# Patient Record
Sex: Male | Born: 1995 | Hispanic: Yes | Marital: Married
Health system: Midwestern US, Community
[De-identification: ages and names within clinical notes are randomized; demographics above are authoritative.]

---
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
## 2017-04-04 ENCOUNTER — Encounter: Payer: Self-pay | Admitting: Emergency Medicine

## 2017-04-04 ENCOUNTER — Ambulatory Visit (INDEPENDENT_AMBULATORY_CARE_PROVIDER_SITE_OTHER): Payer: BLUE CROSS/BLUE SHIELD | Admitting: Emergency Medicine

## 2017-04-04 VITALS — BP 110/66 | HR 66 | Temp 98.3°F | Resp 16 | Ht 68.5 in | Wt 123.0 lb

## 2017-04-04 DIAGNOSIS — R634 Abnormal weight loss: Secondary | ICD-10-CM | POA: Diagnosis not present

## 2017-04-04 NOTE — Progress Notes (Signed)
Collin Moore 21 y.o.   Chief Complaint  Patient presents with  . Weight Loss    x 3-4 months    HISTORY OF PRESENT ILLNESS: This is a 21 y.o. male complaining of recent weight loss, approx 10 lbs since returning from [place] last July. Denies diarrhea, n/v, fever, or any other significant symptoms. Smoking:no Drinking:no Sleeping:adequate Work:in school Exercise:martial arts Stress:high; girlfriend pregnant Nutrition:great apettite Drugs:no HPI   Prior to Admission medications   Medication Sig Start Date End Date Taking? Authorizing Provider  cyclobenzaprine (FLEXERIL) 10 MG tablet Take 1 tablet (10 mg total) by mouth at bedtime as needed for muscle spasms. Patient not taking: Reported on 04/04/2017 09/11/16   Russo, [place] N, PA-C  ibuprofen (ADVIL,MOTRIN) 600 MG tablet Take 1 tablet (600 mg total) by mouth every 6 (six) hours as needed. Patient not taking: Reported on 04/04/2017 08/28/16   Audry PiliMohr, Tyler, PA-C  methocarbamol (ROBAXIN) 500 MG tablet Take 1 tablet (500 mg total) by mouth 2 (two) times daily. Patient not taking: Reported on 04/04/2017 08/28/16   Audry PiliMohr, Tyler, PA-C    No Known Allergies  There are no active problems to display for this patient.   No past medical history on file.  No past surgical history on file.  Social History   Social History  . Marital status: Single    Spouse name: N/A  . Number of children: N/A  . Years of education: N/A   Occupational History  . Not on file.   Social History Main Topics  . Smoking status: Never Smoker  . Smokeless tobacco: Never Used  . Alcohol use No  . Drug use: No  . Sexual activity: Not on file   Other Topics Concern  . Not on file   Social History Narrative  . No narrative on file    No family history on file.   Review of Systems  Constitutional: Positive for weight loss. Negative for chills, fever and malaise/fatigue.  HENT: Negative.   Eyes: Negative.   Respiratory: Negative.  Negative for  cough, hemoptysis and shortness of breath.   Cardiovascular: Negative.  Negative for chest pain.  Gastrointestinal: Negative.  Negative for abdominal pain, blood in stool, diarrhea, melena, nausea and vomiting.  Genitourinary: Negative.  Negative for dysuria and hematuria.  Musculoskeletal: Negative.  Negative for back pain, joint pain and neck pain.  Skin: Negative.  Negative for rash.  Neurological: Negative.  Negative for dizziness, focal weakness, weakness and headaches.  Endo/Heme/Allergies: Negative.   All other systems reviewed and are negative.   Vitals:   04/04/17 1528  BP: 110/66  Pulse: 66  Resp: 16  Temp: 98.3 F (36.8 C)  SpO2: 100%    Physical Exam  Constitutional: He is oriented to person, place, and time. He appears well-developed.  thin  HENT:  Head: Normocephalic and atraumatic.  Nose: Nose normal.  Mouth/Throat: Oropharynx is clear and moist.  Eyes: Pupils are equal, round, and reactive to light. Conjunctivae and EOM are normal.  Neck: Normal range of motion. Neck supple. No JVD present. No thyromegaly present.  Cardiovascular: Normal rate, regular rhythm, normal heart sounds and intact distal pulses.   Pulmonary/Chest: Effort normal and breath sounds normal.  Abdominal: Soft. Bowel sounds are normal. He exhibits no distension and no mass. There is no tenderness.  Musculoskeletal: Normal range of motion.  Neurological: He is alert and oriented to person, place, and time. No sensory deficit. He exhibits normal muscle tone.  Skin: Skin is warm and  dry. Capillary refill takes less than 2 seconds. No rash noted.  Psychiatric: He has a normal mood and affect. His behavior is normal.  Vitals reviewed.    ASSESSMENT & PLAN: [name] was seen today for weight loss.  Diagnoses and all orders for this visit:  Loss of weight -     CBC with Differential/Platelet -     Comprehensive metabolic panel -     Thyroid Profile -     HIV antibody    Patient  Instructions       IF you received an x-ray today, you will receive an invoice from [facility]. Please contact [facility] at [phone] with questions or concerns regarding your invoice.   IF you received labwork today, you will receive an invoice from [facility]. Please contact [facility] at [phone] with questions or concerns regarding your invoice.   Our billing staff will not be able to assist you with questions regarding bills from these companies.  You will be contacted with the lab results as soon as they are available. The fastest way to get your results is to activate your My Chart account. Instructions are located on the last page of this paperwork. If you have not heard from [place] regarding the results in 2 weeks, please contact this office.     Failure to Thrive, Adult Failure to thrive is a group of problems. These problems include eating too little and losing weight. People who have this condition may do fewer and fewer activities over time. They may lose interest in being with friends or they may not want to eat or drink. Follow these instructions at home:  Take medicines only as told by your doctor.  Eat a healthy, well-balanced diet. Make sure that you eat enough.  Be active. Do strength training. A physical therapist can help to set up an exercise program that fits you.  Make sure that you are safe at home.  Make sure that you have a plan for what to do if you cannot make decisions for yourself. Contact a doctor if:  You are not able to eat well.  You are not able to move around.  You feel very sad.  You feel very hopeless. Get help right away if:  You think about ending your life.  You cannot eat or drink.  You do not get out of bed.  Staying at home is not safe.  You have a fever. This information is not intended to replace advice given to you by your health care provider. Make sure you discuss any questions you have with your  health care provider. Document Released: 05/25/2011 Document Revised: 11/11/2015 Document Reviewed: 08/31/2014 Elsevier Interactive Patient Education  2018 Elsevier Inc.      Edwina Barth, MD [facility]

## 2017-04-04 NOTE — Patient Instructions (Addendum)
     IF you received an x-ray today, you will receive an invoice from [facility]. Please contact [facility] at [phone] with questions or concerns regarding your invoice.   IF you received labwork today, you will receive an invoice from [facility]. Please contact [facility] at [phone] with questions or concerns regarding your invoice.   Our billing staff will not be able to assist you with questions regarding bills from these companies.  You will be contacted with the lab results as soon as they are available. The fastest way to get your results is to activate your My Chart account. Instructions are located on the last page of this paperwork. If you have not heard from us regarding the results in 2 weeks, please contact this office.     Failure to Thrive, Adult Failure to thrive is a group of problems. These problems include eating too little and losing weight. People who have this condition may do fewer and fewer activities over time. They may lose interest in being with friends or they may not want to eat or drink. Follow these instructions at home:  Take medicines only as told by your doctor.  Eat a healthy, well-balanced diet. Make sure that you eat enough.  Be active. Do strength training. A physical therapist can help to set up an exercise program that fits you.  Make sure that you are safe at home.  Make sure that you have a plan for what to do if you cannot make decisions for yourself. Contact a doctor if:  You are not able to eat well.  You are not able to move around.  You feel very sad.  You feel very hopeless. Get help right away if:  You think about ending your life.  You cannot eat or drink.  You do not get out of bed.  Staying at home is not safe.  You have a fever. This information is not intended to replace advice given to you by your health care provider. Make sure you discuss any questions you have with your health care  provider. Document Released: 05/25/2011 Document Revised: 11/11/2015 Document Reviewed: 08/31/2014 Elsevier Interactive Patient Education  [employer]2018 Elsevier Inc.

## 2017-04-04 NOTE — Addendum Note (Signed)
Addended by: Isaac BlissGALLOWAY, Bowie Doiron J on: 04/04/2017 04:37 PM   Modules accepted: Orders

## 2017-04-05 LAB — COMPREHENSIVE METABOLIC PANEL
ALBUMIN: 4.9 g/dL (ref 3.5–5.5)
ALT: 16 IU/L (ref 0–44)
AST: 15 IU/L (ref 0–40)
Albumin/Globulin Ratio: 2 (ref 1.2–2.2)
Alkaline Phosphatase: 42 IU/L (ref 39–117)
BILIRUBIN TOTAL: 0.8 mg/dL (ref 0.0–1.2)
BUN / CREAT RATIO: 8 — AB (ref 9–20)
BUN: 7 mg/dL (ref 6–20)
CHLORIDE: 102 mmol/L (ref 96–106)
CO2: 27 mmol/L (ref 20–29)
CREATININE: 0.83 mg/dL (ref 0.76–1.27)
Calcium: 9.5 mg/dL (ref 8.7–10.2)
GFR calc non Af Amer: 128 mL/min/{1.73_m2} (ref >59)
GFR, EST AFRICAN AMERICAN: 147 mL/min/{1.73_m2} (ref >59)
GLOBULIN, TOTAL: 2.5 g/dL (ref 1.5–4.5)
GLUCOSE: 74 mg/dL (ref 65–99)
Potassium: 4.1 mmol/L (ref 3.5–5.2)
SODIUM: 143 mmol/L (ref 134–144)
TOTAL PROTEIN: 7.4 g/dL (ref 6.0–8.5)

## 2017-04-05 LAB — THYROID PANEL
FREE THYROXINE INDEX: 2.2 (ref 1.2–4.9)
T3 Uptake Ratio: 25 % (ref 24–39)
T4, Total: 8.6 ug/dL (ref 4.5–12.0)

## 2017-04-05 LAB — CBC WITH DIFFERENTIAL/PLATELET
BASOS ABS: 0 10*3/uL (ref 0.0–0.2)
BASOS: 0 %
EOS (ABSOLUTE): 0.2 10*3/uL (ref 0.0–0.4)
Eos: 3 %
HEMATOCRIT: 43.2 % (ref 37.5–51.0)
HEMOGLOBIN: 15 g/dL (ref 13.0–17.7)
IMMATURE GRANS (ABS): 0 10*3/uL (ref 0.0–0.1)
Immature Granulocytes: 0 %
LYMPHS: 24 %
Lymphocytes Absolute: 1.3 10*3/uL (ref 0.7–3.1)
MCH: 30.6 pg (ref 26.6–33.0)
MCHC: 34.7 g/dL (ref 31.5–35.7)
MCV: 88 fL (ref 79–97)
Monocytes Absolute: 0.4 10*3/uL (ref 0.1–0.9)
Monocytes: 8 %
NEUTROS ABS: 3.4 10*3/uL (ref 1.4–7.0)
Neutrophils: 65 %
Platelets: 161 10*3/uL (ref 150–379)
RBC: 4.9 x10E6/uL (ref 4.14–5.80)
RDW: 13.6 % (ref 12.3–15.4)
WBC: 5.3 10*3/uL (ref 3.4–10.8)

## 2017-04-05 LAB — HIV ANTIBODY (ROUTINE TESTING W REFLEX): HIV Screen 4th Generation wRfx: NONREACTIVE

## 2017-04-06 ENCOUNTER — Encounter: Payer: Self-pay | Admitting: Radiology

## 2017-04-11 ENCOUNTER — Encounter: Payer: Self-pay | Admitting: Emergency Medicine

## 2017-04-11 ENCOUNTER — Ambulatory Visit (INDEPENDENT_AMBULATORY_CARE_PROVIDER_SITE_OTHER): Payer: BLUE CROSS/BLUE SHIELD | Admitting: Emergency Medicine

## 2017-04-11 VITALS — BP 102/62 | HR 57 | Temp 98.3°F | Resp 17 | Ht 70.0 in | Wt 126.0 lb

## 2017-04-11 DIAGNOSIS — R4582 Worries: Secondary | ICD-10-CM | POA: Diagnosis not present

## 2017-04-11 DIAGNOSIS — Z202 Contact with and (suspected) exposure to infections with a predominantly sexual mode of transmission: Secondary | ICD-10-CM | POA: Diagnosis not present

## 2017-04-11 MED ORDER — AZITHROMYCIN 1 G PO PACK: 1.0000 g | PACK | Freq: Once | ORAL | 0 refills | Status: AC

## 2017-04-11 NOTE — Progress Notes (Signed)
Collin Moore 21 y.o.   Chief Complaint  Patient presents with  . STI check    no symptoms     HISTORY OF PRESENT ILLNESS: This is a 21 y.o. male girlfriend tested positive for Chlamydia; asymptomatic. Seen by me last week for weight loss; lab results reviewed with patient in the room and all questions answered.  HPI   Prior to Admission medications   Not on File    No Known Allergies  Patient Active Problem List   Diagnosis Date Noted  . Loss of weight 04/04/2017    No past medical history on file.  No past surgical history on file.  Social History   Social History  . Marital status: Single    Spouse name: N/A  . Number of children: N/A  . Years of education: N/A   Occupational History  . Not on file.   Social History Main Topics  . Smoking status: Never Smoker  . Smokeless tobacco: Never Used  . Alcohol use No  . Drug use: No  . Sexual activity: Yes   Other Topics Concern  . Not on file   Social History Narrative  . No narrative on file    No family history on file.   Review of Systems  Constitutional: Negative.  Negative for chills and fever.  HENT: Negative for sore throat.   Eyes: Negative for discharge and redness.  Respiratory: Negative for cough and shortness of breath.   Cardiovascular: Negative for chest pain.  Gastrointestinal: Negative for abdominal pain, nausea and vomiting.  Musculoskeletal: Negative for joint pain.  Skin: Negative for rash.  Neurological: Negative for dizziness and headaches.   Vitals:   04/11/17 1343  BP: 102/62  Pulse: (!) 57  Resp: 17  Temp: 98.3 F (36.8 C)  SpO2: 98%     Physical Exam  Constitutional: He is oriented to person, place, and time. He appears well-developed and well-nourished.  HENT:  Head: Normocephalic and atraumatic.  Eyes: Pupils are equal, round, and reactive to light. EOM are normal.  Cardiovascular: Normal rate.   Pulmonary/Chest: Effort normal.  Musculoskeletal: Normal range  of motion.  Neurological: He is alert and oriented to person, place, and time.  Skin: Skin is warm and dry. Capillary refill takes less than 2 seconds. No rash noted.  Psychiatric: He has a normal mood and affect. His behavior is normal.  Vitals reviewed.    ASSESSMENT & PLAN: Bharath was seen today for sti check.  Diagnoses and all orders for this visit:  Worries  Exposure to chlamydia -     azithromycin (ZITHROMAX) 1 g powder; Take 1 packet (1 g total) by mouth once.    Patient Instructions       IF you received an x-ray today, you will receive an invoice from [facility]. Please contact [facility] at [phone] with questions or concerns regarding your invoice.   IF you received labwork today, you will receive an invoice from [facility]. Please contact [facility] at [phone] with questions or concerns regarding your invoice.   Our billing staff will not be able to assist you with questions regarding bills from these companies.  You will be contacted with the lab results as soon as they are available. The fastest way to get your results is to activate your My Chart account. Instructions are located on the last page of this paperwork. If you have not heard from [place] regarding the results in 2 weeks, please contact this office.  Preventing Sexually Transmitted Infections, Adult Sexually transmitted infections (STIs) are diseases that are passed (transmitted) from person to person through bodily fluids exchanged during sex or sexual contact. Bodily fluids include saliva, semen, blood, vaginal mucus, and urine. You may have an increased risk for developing an STI if you have unprotected oral, vaginal, or anal sex. Some common STIs include:  Herpes.  Hepatitis B.  Chlamydia.  Gonorrhea.  Syphilis.  HPV (human papillomavirus).  HIV (humanimmunodeficiency virus), the virus that can cause AIDS (acquired immunodeficiency virus).  How can I protect  myself from sexually transmitted infections? The only way to completely prevent STIs is not to have sex of any kind (practice abstinence). This includes oral, vaginal, or anal sex. If you are sexually active, take these actions to lower your risk of getting an STI:  Have only one sex partner (be monogamous) or limit the number of sexual partners you have.  Stay up-to-date on immunizations. Certain vaccines can lower your risk of getting certain STIs, such as: ? Hepatitis A and B vaccines. You may have been vaccinated as a young child, but likely need a booster shot as a teen or young adult. ? HPV vaccine. This vaccine is recommended if you are a man under age 53 or a woman under age 9.  Use methods that prevent the exchange of body fluids between partners (barrier protection) every time you have sex. Barrier protection can be used during oral, vaginal, or anal sex. Commonly used barrier methods include: ? Male condom. ? Male condom. ? Dental dam.  Get tested regularly for STIs. Have your sexual partner get tested regularly as well.  Avoid mixing alcohol, drugs, and sex. Alcohol and drug use can affect your ability to make good decisions and can lead to risky sexual behaviors.  Ask your health care provider about taking pre-exposure prophylaxis (PrEP) to prevent HIV infection if you: ? Have a HIV-positive sexual partner. ? Have multiple sexual partners or partners who do not know their HIV status, and do not regularly use a condom during sex. ? Use injection drugs and share needles.  Birth control pills, injections, implants, and intrauterine devices (IUDs) do not protect against STIs. To prevent both STIs and pregnancy, always use a condom with another form of birth control. Some STIs, such as herpes, are spread through skin to skin contact. A condom does not protect you from getting such STIs. If you or your partner have herpes and there is an active flare with open sores, avoid all  sexual contact. Why are these changes important? Taking steps to practice safe sex protects you and others. Many STIs can be cured. However, some STIs are not curable and will affect you for the rest of your life. STIs can be passed on to another person even if you do not have symptoms. What can happen if changes are not made? Certain STIs may:  Require you to take medicine for the rest of your life.  Affect your ability to have children (your fertility).  Increase your risk for developing another STI or certain serious health conditions, such as: ? Cervical cancer. ? Head and neck cancer. ? Pelvic inflammatory disease (PID) in women. ? Organ damage or damage to other parts of your body, if the infection spreads.  Be passed to a baby during childbirth.  How are sexually transmitted infections treated? If you or your partner know or think that you may have an STI:  Talk with your healthcare provider about what can  be done to treat it. Some STIs can be treated and cured with medicines.  For curable STIs, you and your partner should avoid sex during treatment and for several days after treatment is complete.  You and your partner should both be treated at the same time, if there is any chance that your partner is infected as well. If you get treatment but your partner does not, your partner can re-infect you when you resume sexual contact.  Do not have unprotected sex.  Where to find more information: Learn more about sexually transmitted diseases and infections from:  Centers for Disease Control and Prevention: ? More information about specific STIs: SolutionApps.co.zawww.cdc.gov/std ? Find places to get sexual health counseling and treatment for free or for a low cost: gettested.TonerPromos.nocdc.gov  U.S. Department of Health and Human Services: NotebookPreviews.siwww.womenshealth.gov/publications/our-publications/fact-sheet/sexually-transmitted-infections.html  Summary  The only way to completely prevent STIs is not to have  sex (practice abstinence), including oral, vaginal, or anal sex.  STIs can spread through saliva, semen, blood, vaginal mucus, urine, or sexual contact.  If you do have sex, limit your number of sexual partners and use a barrier protection method every time you have sex.  If you develop an STI, get treated right away and ask your partner to be treated as well. Do not resume having sex until both of you have completed treatment for the STI. This information is not intended to replace advice given to you by your health care provider. Make sure you discuss any questions you have with your health care provider. Document Released: 06/01/2016 Document Revised: 06/01/2016 Document Reviewed: 06/01/2016 Elsevier Interactive Patient Education  2018 Elsevier Inc.      Edwina BarthMiguel Jaymes Revels, MD [facility]

## 2017-04-11 NOTE — Patient Instructions (Addendum)
IF you received an x-ray today, you will [facility] an [facility] from [facility]. Please contact [facility] at [phone] with questions or concerns regarding your invoice.   IF you received labwork today, you will receive an invoice from [facility]. Please contact [facility] at [phone] with questions or concerns regarding your invoice.   Our billing staff will not be able to assist you with questions regarding bills from these companies.  You will be contacted with the lab results as soon as they are available. The fastest way to get your results is to activate your My Chart account. Instructions are located on the last page of this paperwork. If you have not heard from us regarding the results in 2 weeks, please contact this office.      Preventing Sexually Transmitted Infections, Adult Sexually transmitted infections (STIs) are diseases that are passed (transmitted) from person to person through bodily fluids exchanged during sex or sexual contact. Bodily fluids include saliva, semen, blood, vaginal mucus, and urine. You may have an increased risk for developing an STI if you have unprotected oral, vaginal, or anal sex. Some common STIs include:  Herpes.  Hepatitis B.  Chlamydia.  Gonorrhea.  Syphilis.  HPV (human papillomavirus).  HIV (humanimmunodeficiency virus), the virus that can cause AIDS (acquired immunodeficiency virus).  How can I protect myself from sexually transmitted infections? The only way to completely prevent STIs is not to have sex of any kind (practice abstinence). This includes oral, vaginal, or anal sex. If you are sexually active, take these actions to lower your risk of getting an STI:  Have only one sex partner (be monogamous) or limit the number of sexual partners you have.  Stay up-to-date on immunizations. Certain vaccines can lower your risk of getting certain STIs, such as: ? Hepatitis A and B vaccines. You may have been  vaccinated as a young child, but likely need a booster shot as a teen or young adult. ? HPV vaccine. This vaccine is recommended if you are a man under age 19 or a woman under age 19.  Use methods that prevent the exchange of body fluids between partners (barrier protection) every time you have sex. Barrier protection can be used during oral, vaginal, or anal sex. Commonly used barrier methods include: ? Male condom. ? Male condom. ? Dental dam.  Get tested regularly for STIs. Have your sexual partner get tested regularly as well.  Avoid mixing alcohol, drugs, and sex. Alcohol and drug use can affect your ability to make good decisions and can lead to risky sexual behaviors.  Ask your health care provider about taking pre-exposure prophylaxis (PrEP) to prevent HIV infection if you: ? Have a HIV-positive sexual partner. ? Have multiple sexual partners or partners who do not know their HIV status, and do not regularly use a condom during sex. ? Use injection drugs and share needles.  Birth control pills, injections, implants, and intrauterine devices (IUDs) do not protect against STIs. To prevent both STIs and pregnancy, always use a condom with another form of birth control. Some STIs, such as herpes, are spread through skin to skin contact. A condom does not protect you from getting such STIs. If you or your partner have herpes and there is an active flare with open sores, avoid all sexual contact. Why are these changes important? Taking steps to practice safe sex protects you and others. Many STIs can be cured. However, some STIs are not curable and will affect you for  the rest of your life. STIs can be passed on to another person even if you do not have symptoms. What can happen if changes are not made? Certain STIs may:  Require you to take medicine for the rest of your life.  Affect your ability to have children (your fertility).  Increase your risk for developing another STI or  certain serious health conditions, such as: ? Cervical cancer. ? Head and neck cancer. ? Pelvic inflammatory disease (PID) in women. ? Organ damage or damage to other parts of your body, if the infection spreads.  Be passed to a baby during childbirth.  How are sexually transmitted infections treated? If you or your partner know or think that you may have an STI:  Talk with your healthcare provider about what can be done to treat it. Some STIs can be treated and cured with medicines.  For curable STIs, you and your partner should avoid sex during treatment and for several days after treatment is complete.  You and your partner should both be treated at the same time, if there is any chance that your partner is infected as well. If you get treatment but your partner does not, your partner can re-infect you when you resume sexual contact.  Do not have unprotected sex.  Where to find more information: Learn more about sexually transmitted diseases and infections from:  Centers for Disease Control and Prevention: ? More information about specific STIs: SolutionApps.co.zawww.cdc.gov/std ? Find places to get sexual health counseling and treatment for free or for a low cost: gettested.TonerPromos.nocdc.gov  U.S. Department of Health and Human Services: NotebookPreviews.siwww.womenshealth.gov/publications/our-publications/fact-sheet/sexually-transmitted-infections.html  Summary  The only way to completely prevent STIs is not to have sex (practice abstinence), including oral, vaginal, or anal sex.  STIs can spread through saliva, semen, blood, vaginal mucus, urine, or sexual contact.  If you do have sex, limit your number of sexual partners and use a barrier protection method every time you have sex.  If you develop an STI, get treated right away and ask your partner to be treated as well. Do not resume having sex until both of you have completed treatment for the STI. This information is not intended to replace advice given to you by  your health care provider. Make sure you discuss any questions you have with your health care provider. Document Released: 06/01/2016 Document Revised: 06/01/2016 Document Reviewed: 06/01/2016 Elsevier Interactive Patient Education  [employer]2018 Elsevier Inc.

## 2018-04-21 ENCOUNTER — Emergency Department (HOSPITAL_BASED_OUTPATIENT_CLINIC_OR_DEPARTMENT_OTHER)
Admission: EM | Admit: 2018-04-21 | Discharge: 2018-04-21 | Disposition: A | Discharge status: Home / Self Care | Payer: Self-pay | Attending: Emergency Medicine | Admitting: Emergency Medicine

## 2018-04-21 ENCOUNTER — Other Ambulatory Visit: Payer: Self-pay

## 2018-04-21 ENCOUNTER — Encounter (HOSPITAL_BASED_OUTPATIENT_CLINIC_OR_DEPARTMENT_OTHER): Payer: Self-pay | Admitting: *Deleted

## 2018-04-21 ENCOUNTER — Emergency Department (HOSPITAL_BASED_OUTPATIENT_CLINIC_OR_DEPARTMENT_OTHER): Payer: Self-pay

## 2018-04-21 DIAGNOSIS — Y93G1 Activity, food preparation and clean up: Secondary | ICD-10-CM | POA: Insufficient documentation

## 2018-04-21 DIAGNOSIS — Y998 Other external cause status: Secondary | ICD-10-CM | POA: Insufficient documentation

## 2018-04-21 DIAGNOSIS — W010XXA Fall on same level from slipping, tripping and stumbling without subsequent striking against object, initial encounter: Secondary | ICD-10-CM | POA: Insufficient documentation

## 2018-04-21 DIAGNOSIS — Y92010 Kitchen of single-family (private) house as the place of occurrence of the external cause: Secondary | ICD-10-CM | POA: Insufficient documentation

## 2018-04-21 DIAGNOSIS — S41112A Laceration without foreign body of left upper arm, initial encounter: Secondary | ICD-10-CM | POA: Insufficient documentation

## 2018-04-21 DIAGNOSIS — W260XXA Contact with knife, initial encounter: Secondary | ICD-10-CM | POA: Insufficient documentation

## 2018-04-21 MED ORDER — LIDOCAINE-EPINEPHRINE (PF) 1 %-1:200000 IJ SOLN
INTRAMUSCULAR | Status: AC
  Filled 2018-04-21: qty 10

## 2018-04-21 MED ORDER — LIDOCAINE-EPINEPHRINE 2 %-1:100000 IJ SOLN
20.0000 mL | Freq: Once | INTRAMUSCULAR | Status: AC
  Administered 2018-04-21: 20 mL via INTRADERMAL
  Filled 2018-04-21: qty 20

## 2018-04-21 MED ORDER — CEPHALEXIN 500 MG PO CAPS: 500.0000 mg | ORAL_CAPSULE | Freq: Four times a day (QID) | ORAL | 0 refills | Status: AC

## 2018-04-21 MED ORDER — LIDOCAINE-EPINEPHRINE (PF) 1 %-1:200000 IJ SOLN: 10.0000 mL | Freq: Once | INTRAMUSCULAR | Status: DC

## 2018-04-21 NOTE — ED Notes (Signed)
Suture Cart at bedside.  

## 2018-04-21 NOTE — ED Notes (Signed)
Patient transported to X-ray 

## 2018-04-21 NOTE — ED Provider Notes (Signed)
[facility] EMERGENCY DEPARTMENT Provider Note   CSN: [id number] Arrival date & time: 04/21/18  1709     History   Chief Complaint Chief Complaint  Patient presents with  . Extremity Laceration    HPI Collin Moore is a 22 y.o. male Collin Moore for evaluation of left upper extremity laceration that occurred just prior to ED arrival.  Patient reports that he was cooking at home and states that he fell with a knife in his hand causing a laceration to the lateral aspect of his left upper extremity just proximal to the elbow.  He states his tetanus was last 3 years.  He denies any numbness/weakness to the arm.  The history is provided by the patient.    History reviewed. No pertinent past medical history.  Patient Active Problem List   Diagnosis Date Noted  . Exposure to chlamydia 04/11/2017  . Worries 04/11/2017  . Loss of weight 04/04/2017    History reviewed. No pertinent surgical history.      Home Medications    Prior to Admission medications   Medication Sig Start Date End Date Taking? Authorizing Provider  cephALEXin (KEFLEX) 500 MG capsule Take 1 capsule (500 mg total) by mouth 4 (four) times daily for 7 days. 04/21/18 04/28/18  Maxwell Caul, PA-C    Family History No family history on file.  Social History Social History   Tobacco Use  . Smoking status: Never Smoker  . Smokeless tobacco: Never Used  Substance Use Topics  . Alcohol use: No  . Drug use: No     Allergies   Patient has no known allergies.   Review of Systems Review of Systems  Skin: Positive for wound.  Neurological: Negative for weakness and numbness.  All other systems reviewed and are negative.    Physical Exam Updated Vital Signs BP 119/80 (BP Location: Right Arm)   Pulse 77   Temp 98.2 F (36.8 C) (Oral)   Resp 18   Ht 5\' 9"  (1.753 m)   Wt 57.2 kg   SpO2 99%   BMI 18.61 kg/m   Physical Exam  Constitutional: He appears well-developed and well-nourished.    HENT:  Head: Normocephalic and atraumatic.  Eyes: Conjunctivae and EOM are normal. Right eye exhibits no discharge. Left eye exhibits no discharge. No scleral icterus.  Cardiovascular:  Pulses:      Radial pulses are 2+ on the right side, and 2+ on the left side.  Pulmonary/Chest: Effort normal.  Musculoskeletal:  Flexion/extension of left elbow intact with any difficulty.  Neurological: He is alert.  Sensation intact along major nerve distributions of BUE Equal grip strength bilaterally.  Skin: Skin is warm and dry. Capillary refill takes less than 2 seconds.  2 cm wound noted just [place] to the left elbow. Good distal cap refill.  LUE is not dusky in appearance or cool to touch.  Psychiatric: He has a normal mood and affect. His speech is normal and behavior is normal.  Nursing note and vitals reviewed.    ED Treatments / Results  Labs (all labs ordered are listed, but only abnormal results are displayed) Labs Reviewed - No data to display  EKG None  Radiology Dg Elbow Complete Left  Result Date: 04/21/2018 CLINICAL DATA:  Fall.  Elbow laceration.  Initial encounter. EXAM: LEFT ELBOW - COMPLETE 3+ VIEW COMPARISON:  None. FINDINGS: There is no evidence of fracture, dislocation, or joint effusion. There is no evidence of arthropathy or other focal bone abnormality.  Soft tissues are unremarkable. No radiopaque foreign body is identified. IMPRESSION: Negative. Electronically Signed   By: Sebastian Ache M.D.   On: 04/21/2018 19:01    Procedures .Marland KitchenLaceration Repair Date/Time: 04/21/2018 7:44 PM Performed by: Maxwell Caul, PA-C Authorized by: Maxwell Caul, PA-C   Consent:    Consent obtained:  Verbal   Consent given by:  Patient   Risks discussed:  Infection, need for additional repair, pain, poor cosmetic result and poor wound healing   Alternatives discussed:  No treatment and delayed treatment Universal protocol:    Procedure explained and questions answered to  patient or proxy's satisfaction: yes     Relevant documents present and verified: yes     Test results available and properly labeled: yes     Imaging studies available: yes     Required blood products, implants, devices, and special equipment available: yes     Site/side marked: yes     Immediately prior to procedure, a time out was called: yes     Patient identity confirmed:  Verbally with patient Anesthesia (see MAR for exact dosages):    Anesthesia method:  Local infiltration   Local anesthetic:  Lidocaine 1% WITH epi Laceration details:    Location:  Shoulder/arm   Shoulder/arm location:  L upper arm   Length (cm):  2 Repair type:    Repair type:  Simple Pre-procedure details:    Preparation:  Patient was prepped and draped in usual sterile fashion Exploration:    Hemostasis achieved with:  Direct pressure   Wound exploration: wound explored through full range of motion     Wound extent: no foreign bodies/material noted, no muscle damage noted and no tendon damage noted     Contaminated: no   Treatment:    Area cleansed with:  Betadine   Amount of cleaning:  Extensive   Irrigation solution:  Sterile saline   Irrigation method:  Syringe   Visualized foreign bodies/material removed: no   Skin repair:    Repair method:  Sutures   Suture size:  4-0   Suture material:  Nylon   Suture technique:  Simple interrupted   Number of sutures:  5 Approximation:    Approximation:  Close Post-procedure details:    Dressing:  Antibiotic ointment and non-adherent dressing Comments:     Will send wound was properly anesthetized, thoroughly and extensively irrigated with sterile saline.  Expiration of the wound showed no joint capsule disruption.  No evidence that this involved muscle or tendon.  No foreign bodies noted.   (including critical care time)  Medications Ordered in ED Medications  lidocaine-EPINEPHrine (XYLOCAINE W/EPI) 2 %-1:100000 (with pres) injection 20 mL (has no  administration in time range)  lidocaine-EPINEPHrine (XYLOCAINE-EPINEPHrine) 1 %-1:200000 (PF) injection (has no administration in time range)     Initial Impression / Assessment and Plan / ED Course  I have reviewed the triage vital signs and the nursing notes.  Pertinent labs & imaging results that were available during my care of the patient were reviewed by me and considered in my medical decision making (see chart for details).     22 year old male who presents for evaluation of laceration to left upper extremity that occurred just prior to ED arrival.  Tetanus is up-to-date.  No numbness/weakness. Patient is afebrile, non-toxic appearing, sitting comfortably on examination table. Vital signs reviewed and stable.  Patient is neurovascularly intact.  On exam, patient has a 2 cm laceration just proximal to left elbow.  This does not overlie the joint.  We will plan for x-ray evaluation for foreign body.  X-ray reviewed.  Negative for any acute ab normalities.  Laceration as documented above.  No evidence of joint capsule disruption but will plan to use at home on antibiotics given the proximity to the joint.  Patient instructed on wound care precautions. Patient had ample opportunity for questions and discussion. All patient's questions were answered with full understanding. Strict return precautions discussed. Patient expresses understanding and agreement to plan.   Final Clinical Impressions(s) / ED Diagnoses   Final diagnoses:  Laceration of left upper extremity, initial encounter    ED Discharge Orders         Ordered    cephALEXin (KEFLEX) 500 MG capsule  4 times daily     04/21/18 1940           Rosana Hoes 04/21/18 1946    Arby Barrette, MD 04/22/18 0004

## 2018-04-21 NOTE — ED Notes (Signed)
ED Provider at bedside. 

## 2018-04-21 NOTE — ED Triage Notes (Signed)
Pt states he was carrying a knife and fell and the knife hit his left elbow. Avulsion lac noted with bleeding controlled. Dressing applied in triage. Pt states he has joined [employer] and recently had his immunizations updated

## 2018-04-21 NOTE — Discharge Instructions (Signed)
Keep the wound clean and dry for the first 24 hours. After that you may gently clean the wound with soap and water. Make sure to pat dry the wound before covering it with any dressing. You can use topical antibiotic ointment and bandage. Ice and elevate for pain relief.  ° °You can take Tylenol or Ibuprofen as directed for pain. You can alternate Tylenol and Ibuprofen every 4 hours for additional pain relief.  ° °Take antibiotics as directed. Please take all of your antibiotics until finished. ° °Return to the Emergency Department, your primary care doctor, or the [facility] in 5-7 days for suture removal.  ° °Monitor closely for any signs of infection. Return to the Emergency Department for any worsening redness/swelling of the area that begins to spread, drainage from the site, worsening pain, fever or any other worsening or concerning symptoms.  ° ° °

## 2018-04-30 ENCOUNTER — Encounter (HOSPITAL_BASED_OUTPATIENT_CLINIC_OR_DEPARTMENT_OTHER): Payer: Self-pay | Admitting: Emergency Medicine

## 2018-04-30 ENCOUNTER — Other Ambulatory Visit: Payer: Self-pay

## 2018-04-30 ENCOUNTER — Emergency Department (HOSPITAL_BASED_OUTPATIENT_CLINIC_OR_DEPARTMENT_OTHER)
Admission: EM | Admit: 2018-04-30 | Discharge: 2018-04-30 | Disposition: A | Discharge status: Home / Self Care | Payer: Self-pay | Attending: Emergency Medicine | Admitting: Emergency Medicine

## 2018-04-30 DIAGNOSIS — S51012D Laceration without foreign body of left elbow, subsequent encounter: Secondary | ICD-10-CM | POA: Insufficient documentation

## 2018-04-30 DIAGNOSIS — W260XXD Contact with knife, subsequent encounter: Secondary | ICD-10-CM | POA: Insufficient documentation

## 2018-04-30 DIAGNOSIS — Z4802 Encounter for removal of sutures: Secondary | ICD-10-CM | POA: Insufficient documentation

## 2018-04-30 NOTE — ED Triage Notes (Signed)
Reports to ER for suture removal. 

## 2018-04-30 NOTE — Discharge Instructions (Addendum)
Continue to monitor for signs of infection. Return to the emergency room with any new, worsening, concerning symptoms.

## 2018-04-30 NOTE — ED Provider Notes (Signed)
[facility] EMERGENCY DEPARTMENT Provider Note   CSN: [id number] Arrival date & time: 04/30/18  1019     History   Chief Complaint Chief Complaint  Patient presents with  . Wound Check    HPI Zalan Shidler is a 22 y.o. male presenting for suture removal.  Patient states days ago he injured his left elbow accidentally with a knife.  Sutures were placed at that time.  Since then, he has had no issues.  No drainage from the sutures.  No tenderness or swelling.  He has no concerns.  No fevers, chills, nausea, vomiting.  No numbness or tingling.  HPI  History reviewed. No pertinent past medical history.  Patient Active Problem List   Diagnosis Date Noted  . Exposure to chlamydia 04/11/2017  . Worries 04/11/2017  . Loss of weight 04/04/2017    History reviewed. No pertinent surgical history.      Home Medications    Prior to Admission medications   Not on File    Family History History reviewed. No pertinent family history.  Social History Social History   Tobacco Use  . Smoking status: Never Smoker  . Smokeless tobacco: Never Used  Substance Use Topics  . Alcohol use: No  . Drug use: No     Allergies   Patient has no known allergies.   Review of Systems Review of Systems  Skin: Positive for wound (healing).  Neurological: Negative for numbness.     Physical Exam Updated Vital Signs BP 120/81 (BP Location: Right Arm)   Pulse 67   Temp 98 F (36.7 C) (Oral)   Resp 16   Ht 5\' 9"  (1.753 m)   Wt 57.2 kg   SpO2 100%   BMI 18.61 kg/m   Physical Exam  Constitutional: He is oriented to person, place, and time. He appears well-developed and well-nourished. No distress.  HENT:  Head: Normocephalic and atraumatic.  Eyes: EOM are normal.  Neck: Normal range of motion.  Pulmonary/Chest: Effort normal.  Abdominal: He exhibits no distension.  Musculoskeletal: Normal range of motion.  Neurological: He is alert and oriented to person,  place, and time.  Skin: Skin is warm. Capillary refill takes less than 2 seconds. No rash noted.  Well-healing laceration of the left elbow.  No surrounding erythema, tenderness, or drainage.  Full active range of motion of the elbow without difficulty.  Psychiatric: He has a normal mood and affect.  Nursing note and vitals reviewed.    ED Treatments / Results  Labs (all labs ordered are listed, but only abnormal results are displayed) Labs Reviewed - No data to display  EKG None  Radiology No results found.  Procedures .Suture Removal Date/Time: 04/30/2018 11:18 AM Performed by: Alveria Apley, PA-C Authorized by: Alveria Apley, PA-C   Consent:    Consent obtained:  Verbal   Consent given by:  Patient   Risks discussed:  Bleeding and wound separation Location:    Location:  Upper extremity   Upper extremity location:  Elbow   Elbow location:  L elbow Procedure details:    Wound appearance:  No signs of infection, nontender, good wound healing and clean   Number of sutures removed:  5 Post-procedure details:    Post-removal:  No dressing applied   Patient tolerance of procedure:  Tolerated well, no immediate complications   (including critical care time)  Medications Ordered in ED Medications - No data to display   Initial Impression / Assessment and Plan / ED  Course  I have reviewed the triage vital signs and the nursing notes.  Pertinent labs & imaging results that were available during my care of the patient were reviewed by me and considered in my medical decision making (see chart for details).     Pt presenting for suture removal.  Physical exam reassuring, shows well-healing laceration without signs of infection.  Sutures removed and no dehiscence.  Discussed aftercare instructions.  Discussed follow-up as needed.  At this time, patient appears safe for discharge.  Return precautions given.  Patient states he understands and agrees plan.   Final  Clinical Impressions(s) / ED Diagnoses   Final diagnoses:  Visit for suture removal    ED Discharge Orders    None       Alveria ApleyCaccavale, Lemario Chaikin, PA-C 04/30/18 1118    Gwyneth SproutPlunkett, Whitney, MD 04/30/18 1633

## 2019-09-27 IMAGING — CR DG ELBOW COMPLETE 3+V*L*
4 series · 4 of 4 positions shown · non-contrast
Comparison: None.

CLINICAL DATA: Fall.  Elbow laceration.  Initial encounter.

EXAM:
LEFT ELBOW - COMPLETE 3+ VIEW

[x elbow joint ap left]
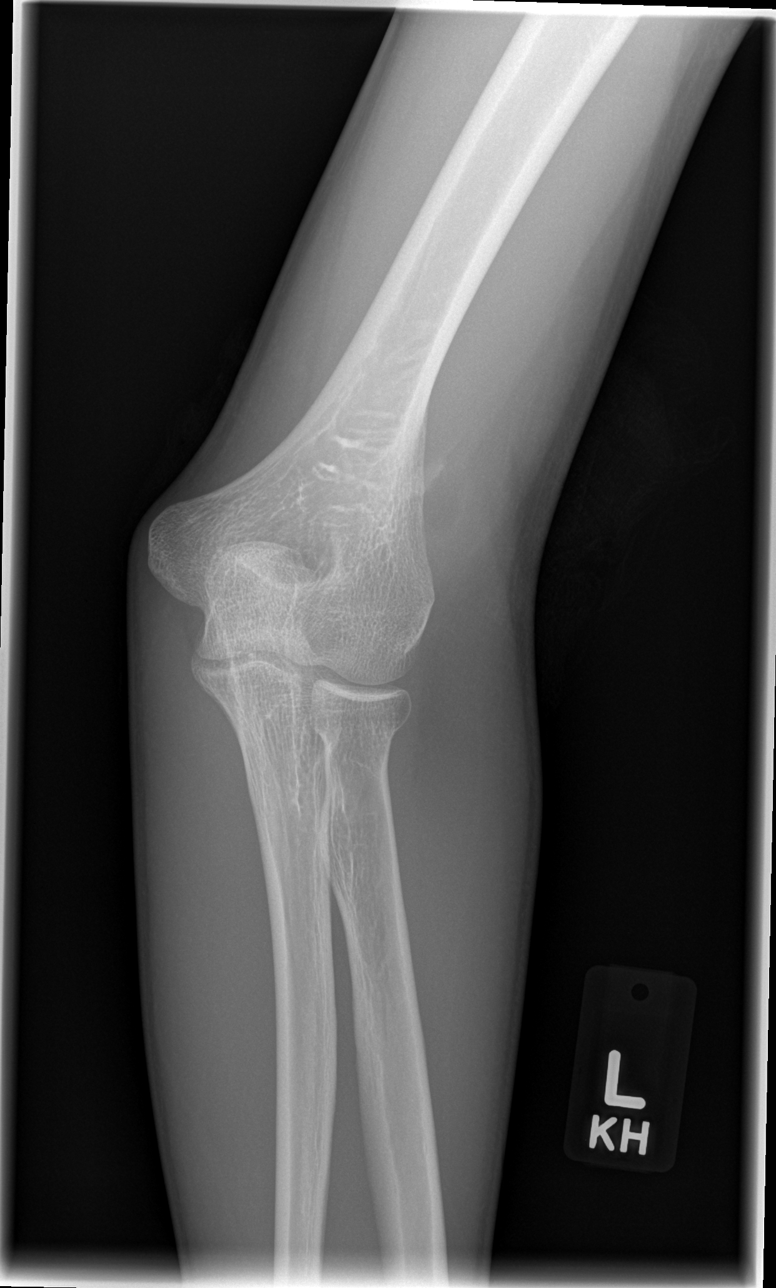

[x elbow joint obl. left (1 of 2)]
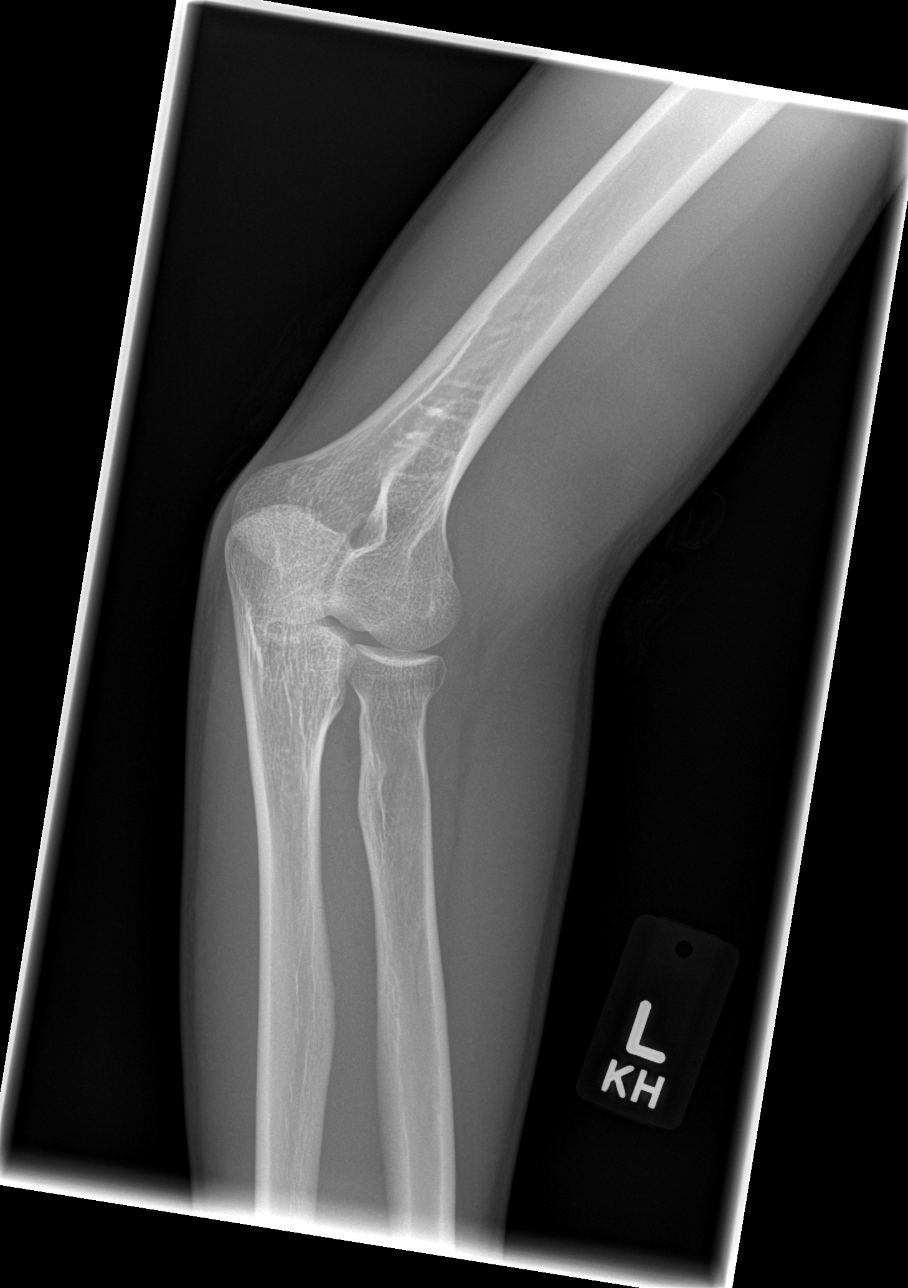

[x elbow joint obl. left (2 of 2)]
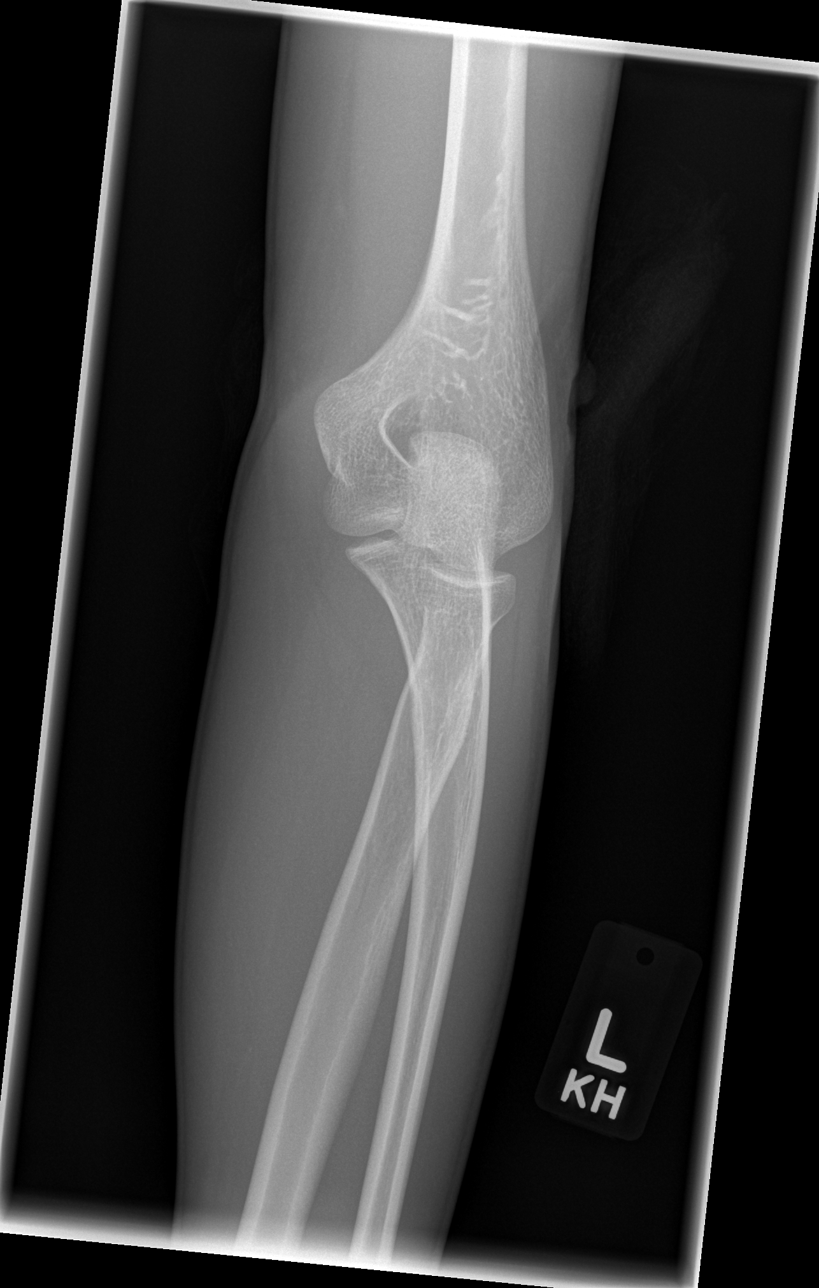

[x elbow joint lat left]
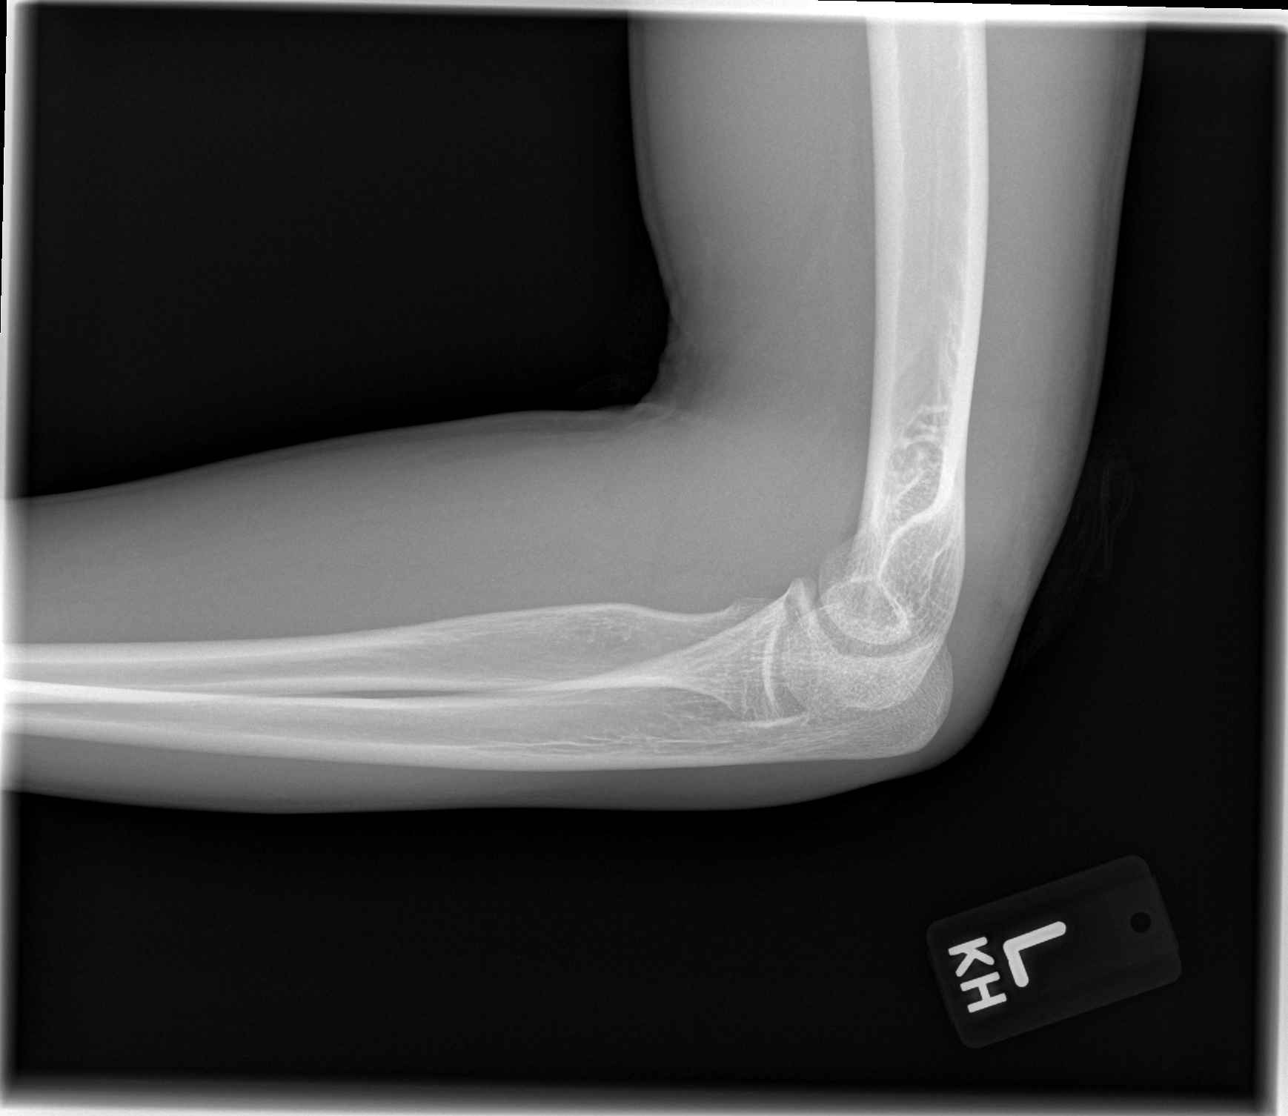

[4 of 4 positions shown; findings below may reference images not displayed]

FINDINGS: There is no evidence of fracture, dislocation, or joint effusion.
There is no evidence of arthropathy or other focal bone abnormality.
Soft tissues are unremarkable. No radiopaque foreign body is
identified.
IMPRESSION: Negative.

## 2022-03-15 ENCOUNTER — Inpatient Hospital Stay
Admit: 2022-03-15 | Discharge: 2022-03-15 | Disposition: A | Discharge status: Home / Self Care | Payer: TRICARE (CHAMPUS) | Admitting: Emergency Medicine

## 2022-03-15 DIAGNOSIS — T7809XA Anaphylactic reaction due to other food products, initial encounter: Secondary | ICD-10-CM

## 2022-03-15 DIAGNOSIS — T782XXA Anaphylactic shock, unspecified, initial encounter: Secondary | ICD-10-CM

## 2022-03-15 MED ORDER — PREDNISONE 20 MG PO TABS
20 MG | ORAL_TABLET | ORAL | 0 refills | Status: AC
Start: 2022-03-15 — End: 2022-03-23

## 2022-03-15 MED ORDER — DIPHENHYDRAMINE HCL 50 MG/ML IJ SOLN
50 MG/ML | INTRAMUSCULAR | Status: AC
Start: 2022-03-15 — End: 2022-03-15
  Administered 2022-03-15: 18:00:00 25 mg via INTRAVENOUS

## 2022-03-15 MED ORDER — DIPHENHYDRAMINE HCL 50 MG/ML IJ SOLN
50 MG/ML | Freq: Four times a day (QID) | INTRAMUSCULAR | Status: DC | PRN
Start: 2022-03-15 — End: 2022-03-15

## 2022-03-15 MED ORDER — FAMOTIDINE (PF) 20 MG/2ML IV SOLN
20 MG/2ML | INTRAVENOUS | Status: AC
Start: 2022-03-15 — End: 2022-03-15
  Administered 2022-03-15: 18:00:00 20 mg via INTRAVENOUS

## 2022-03-15 MED ORDER — EPINEPHRINE 1 MG/10ML IJ SOSY
1 MG/0ML | INTRAMUSCULAR | Status: AC
Start: 2022-03-15 — End: 2022-03-15
  Administered 2022-03-15: 18:00:00 0.3

## 2022-03-15 MED ORDER — DIPHENHYDRAMINE HCL 25 MG PO CAPS
25 MG | ORAL_CAPSULE | Freq: Four times a day (QID) | ORAL | 0 refills | Status: AC | PRN
Start: 2022-03-15 — End: 2022-03-25

## 2022-03-15 MED ORDER — FAMOTIDINE 20 MG PO TABS
20 MG | ORAL_TABLET | Freq: Two times a day (BID) | ORAL | 0 refills | Status: AC
Start: 2022-03-15 — End: 2022-03-25

## 2022-03-15 MED ORDER — ONDANSETRON HCL 4 MG/2ML IJ SOLN
4 MG/2ML | Freq: Four times a day (QID) | INTRAMUSCULAR | Status: DC | PRN
Start: 2022-03-15 — End: 2022-03-15
  Administered 2022-03-15: 18:00:00 4 mg via INTRAVENOUS

## 2022-03-15 MED ORDER — EPINEPHRINE 0.3 MG/0.3ML IJ SOAJ
0.3 MG/ML | Freq: Once | INTRAMUSCULAR | 0 refills | Status: AC
Start: 2022-03-15 — End: 2022-03-15

## 2022-03-15 MED ORDER — METHYLPREDNISOLONE NA SUC (PF) 125 MG IJ SOLR
125 MG | Freq: Four times a day (QID) | INTRAMUSCULAR | Status: DC
Start: 2022-03-15 — End: 2022-03-15

## 2022-03-15 MED ORDER — ACETAMINOPHEN 500 MG PO TABS
500 MG | Freq: Four times a day (QID) | ORAL | Status: DC | PRN
Start: 2022-03-15 — End: 2022-03-15
  Administered 2022-03-15: 18:00:00 1000 mg via ORAL

## 2022-03-15 MED ORDER — EPINEPHRINE PF 1 MG/ML IJ SOLN
1 MG/ML | Freq: Once | INTRAMUSCULAR | Status: DC
Start: 2022-03-15 — End: 2022-03-15

## 2022-03-15 MED ORDER — FAMOTIDINE (PF) 20 MG/2ML IV SOLN
20 MG/2ML | Freq: Two times a day (BID) | INTRAVENOUS | Status: DC
Start: 2022-03-15 — End: 2022-03-15

## 2022-03-15 MED ORDER — METHYLPREDNISOLONE NA SUC (PF) 125 MG IJ SOLR
125 MG | INTRAMUSCULAR | Status: AC
Start: 2022-03-15 — End: 2022-03-15
  Administered 2022-03-15: 18:00:00 125 mg via INTRAVENOUS

## 2022-03-15 MED FILL — ACETAMINOPHEN EXTRA STRENGTH 500 MG PO TABS: 500 MG | ORAL | Qty: 2

## 2022-03-15 MED FILL — EPINEPHRINE 1 MG/10ML IJ SOSY: 1 MG/0ML | INTRAMUSCULAR | Qty: 10

## 2022-03-15 MED FILL — FAMOTIDINE (PF) 20 MG/2ML IV SOLN: 20 MG/2ML | INTRAVENOUS | Qty: 2

## 2022-03-15 MED FILL — ONDANSETRON HCL 4 MG/2ML IJ SOLN: 4 MG/2ML | INTRAMUSCULAR | Qty: 2

## 2022-03-15 MED FILL — DIPHENHYDRAMINE HCL 50 MG/ML IJ SOLN: 50 MG/ML | INTRAMUSCULAR | Qty: 1

## 2022-03-15 MED FILL — SOLU-MEDROL (PF) 125 MG IJ SOLR: 125 MG | INTRAMUSCULAR | Qty: 125

## 2022-03-15 NOTE — ED Notes (Signed)
Pt amb through ED, placed on monitors, medicated per Wade, RN  03/15/22 1406

## 2022-03-15 NOTE — ED Provider Notes (Cosign Needed)
[facility]  Emergency Department Treatment Report    Patient: Edward Patel Age: 26 y.o. Sex: male    Date of Birth: [birth year] Admit Date: 03/15/2022 PCP: No primary care provider on file.   MRN: [id number]  CSN: [id number]  Attending:  Christiana Pellant, MD    Room: ER25/ER25 Time Dictated: 2:04 PM APP:  Thea Silversmith     I hereby certify this patient for admission based upon medical necessity as    noted below:    Chief Complaint   Chief Complaint   Patient presents with    Allergic Reaction     History of Present Illness   26 y.o. male with history of peanut allergy presents to the ED for throat swelling, difficulty breathing after eating a taco with peanuts on it.  Patient says he ate this about 20 minutes prior to my evaluation of him.  Says this has happened to him in the past he has a known allergy to peanuts.  Says he does not have nor has he ever had an epinephrine pen.    Review of Systems   See as outlined above in HPI  Past Medical/Surgical History   No past medical history on file.  No past surgical history on file.        Social History        Family History   No family history on file.    Home Medications     Previous Medications    No medications on file       Allergies     Allergies   Allergen Reactions    Peanut-Containing Drug Products Anaphylaxis       Physical Exam   BP 120/77   Pulse 73   Temp 98.1 F (36.7 C) (Oral)   Resp 15   Ht 1.727 m   Wt 63.5 kg   SpO2 97%   BMI 21.29 kg/m    Constitutional: Patient appears well developed and well nourished. Appearance and behavior are age and situation appropriate.  HEENT: Conjunctiva clear.  PERRLA. Mucous membranes moist. Surface of the pharynx injected, there is some edema, tonsils are enlarged.  Voice is hoarse, very minimal stridor noted.    Neck: Symmetrical, no masses. Normal range of motion.  Respiratory: Lungs clear to auscultation, nonlabored respirations. No tachypnea or accessory muscle use.  Cardiovascular: Heart  regular rate and rhythm without murmur rubs or gallops.   Gastrointestinal:  Abdomen soft, nontender without complaint of pain to palpation.  Musculoskeletal: No deformities of the limbs.  Integumentary: Warm and dry without rashes or lesions  Neurologic: Alert and moving all limbs spontaneously.    Impression and Management Plan   26 year old male with allergy to peanuts ate peanuts, now with anaphylaxis.  IM epinephrine ordered and given.  We will also place a line and started on Solu-Medrol, Pepcid and Benadryl.    Diagnostic Studies       Imaging and Labs:  No results found for any visits on 03/15/22.         Lab:   No results found for this or any previous visit (from the past 24 hour(s)).    ED Course            Patient received the following medications during stay:  Medications   acetaminophen (TYLENOL) tablet 1,000 mg (has no administration in time range)   ondansetron (ZOFRAN) injection 4 mg (has no administration in time range)   methylPREDNISolone sodium (PF) (SOLU-MEDROL PF)  injection 125 mg (125 mg IntraVENous Given 03/15/22 1354)   famotidine (PEPCID) 20 mg in sodium chloride (PF) 0.9 % 10 mL injection (20 mg IntraVENous Given 03/15/22 1355)   diphenhydrAMINE (BENADRYL) injection 25 mg (25 mg IntraVENous Given 03/15/22 1355)   EPINEPHrine 1 MG/10ML injection (0.3 mg  Given 03/15/22 1356)         Most recent vital signs:  BP 120/77   Pulse 73   Temp 98.1 F (36.7 C) (Oral)   Resp 15   Ht 1.727 m   Wt 63.5 kg   SpO2 97%   BMI 21.29 kg/m       RECORDS REVIEWED: I reviewed the patient's previous records here at [facility] and available outside facilities and note that there are none available    EXTERNAL RESULTS REVIEWED: None available    INDEPENDENT HISTORIAN: History and/or plan development assisted by: None    Severe exacerbation or progression of chronic illness: Allergy to peanuts    Threat to body function without evaluation and management: Immunologic, ENT, cardiovascular, pulmonary,  metabolic    SOCIAL DETERMINANTS impacting Evaluation and Management: Provider availability and quality of care: Patient unable to be seen outpatient for anaphylaxis.    Comorbidities impacting Evaluation and Management: None    Critical Care Time (if necessary) none    Medical Decision Making     NARRATIVE: Patient with anaphylaxis, got IM epi.  Now getting steroids, Pepcid and Benadryl.  Will place patient in the ED observation unit for monitoring, medication.    Final Diagnosis     1. Anaphylaxis, initial encounter        Disposition   ED observation      The patient was personally evaluated by myself and discussed with Christiana Pellant, MD   who agrees with the above assessment and plan.      Elray Buba, PA-C  March 15, 2022    My signature above authenticates this document and my orders, the final    diagnosis (es), discharge prescription (s), and instructions in the Epic    record.  If you have any questions please contact [phone].     Nursing notes have been reviewed by the physician/ advanced practice    Clinician.         Elray Buba, PA-C  03/15/22 1404       Elray Buba, PA-C  03/15/22 1651

## 2022-03-15 NOTE — Progress Notes (Cosign Needed)
Patient has gotten epinephrine, Solu-Medrol, Pepcid and Benadryl.  He says he is feeling better.  Throat seems less erythematous and less edematous.  We will continue to monitor.  Vital signs normal.

## 2022-03-15 NOTE — ED Notes (Signed)
Rounded on pt. Pt resting comfortably at this time. VSS. Call bell within reach     Felix Pacini, RN  03/15/22 1620

## 2022-03-15 NOTE — ED Notes (Signed)
Pt amb to restroom and back to bed with RN supervision. Pt denies pain or dizziness when ambulating. Call bell within reach. Pt has no requests at this time     Felix Pacini, RN  03/15/22 1418

## 2022-03-15 NOTE — Discharge Summary (Cosign Needed)
[facility]  Emergency Observation Department   Discharge Summary    Patient: Edward Patel Age: 26 y.o. Sex: male    Date of Birth: Mar 12, 1996 Admit Date: 03/15/2022 PCP: PROVIDER UNKNOWN   MRN: [id number]  CSN: [id number]  Attending: Christiana Pellant, MD     Room: ER25/ER25  APP: Elray Buba, PA-C     ED Physician   Christiana Pellant, MD   Discharge Physician   Christiana Pellant, MD   Observation Admission   03/15/22 2:01 PM  Observation Discharge  03/15/22 4:48 PM    History of Present Illness   26 y.o. male with an allergy to peanuts presented to the ED for throat swelling and shortness of breath, anaphylaxis after eating a taco or peanuts on it.  Patient given epinephrine in the ED and started on Solu-Medrol, Pepcid and Benadryl.  Patient got quick relief of symptoms.  We placed patient in the ED observation unit for monitoring.    ED Observation Course/MDM   Patient sleeping comfortably, vital signs are normal.  Patient says he feels better.  Pharynx without swelling, erythema.  No stridor.    MORE THAN 30 MINUTES OF TIME WAS SPENT ON DISCHARGE SERVICES  Physical Exam   BP 120/77   Pulse 63   Temp 98.1 F (36.7 C) (Oral)   Resp 16   Ht 1.727 m   Wt 63.5 kg   SpO2 98%   BMI 21.29 kg/m    Constitutional: Patient appears well developed and well nourished. Appearance and behavior are age and situation appropriate.  HEENT: Conjunctiva clear.  PERRLA.   Neck: Symmetrical, no masses. Normal range of motion.  Musculoskeletal: No deformities of the limbs.  Neurologic: Alert and moving all limbs spontaneously.      Clinical Impression/diagnosis     1. Anaphylaxis, initial encounter          Disposition and Plan   Patient is discharged home in stable condition. Advised to follow up with PCP.  Patient to continue steroids at home, Pepcid and Benadryl and prescription for epinephrine pen sent to his pharmacy.  Patient advised to return for any new or worsening symptoms.      Patient evaluated by  myself and discussed with Christiana Pellant, MD who agrees with the above assessment and plan.        Medication List        START taking these medications      diphenhydrAMINE 25 MG capsule  Commonly known as: Benadryl Allergy  Take 1 capsule by mouth every 6 hours as needed for Itching or Allergies     EPINEPHrine 0.3 MG/0.3ML Soaj injection  Commonly known as: EpiPen 2-Pak  Inject 0.3 mLs into the muscle once for 1 dose Use as directed for allergic reaction     famotidine 20 MG tablet  Commonly known as: Pepcid  Take 1 tablet by mouth 2 times daily for 10 days     predniSONE 20 MG tablet  Commonly known as: DELTASONE  Take 3 tablets by mouth daily for 3 days, THEN 2 tablets daily for 3 days, THEN 1 tablet daily for 3 days.  Start taking on: March 15, 2022               Where to Get Your Medications        These medications were sent to [facility] [address], [place] - [address] - P [phone] - F [phone]  [address], [place] [address]      Phone: [phone]   diphenhydrAMINE 25 MG capsule  EPINEPHrine 0.3 MG/0.3ML Soaj injection  famotidine 20 MG tablet  predniSONE 20 MG tablet         Dragon medical dictation software was used for portions of this report. Unintended errors may occur.    Owens Loffler, PA-C  March 15, 2022

## 2022-03-15 NOTE — ED Triage Notes (Signed)
Pt arrives amb through triage for throat swelling s/p eating peanuts. Pt does not have epi pen

## 2022-03-15 NOTE — ED Notes (Signed)
Pt amb to restroom and back to bed with RN supervision. Call bell within reach. No requests at this time.     Felix Pacini, RN  03/15/22 [phone]

## 2022-03-15 NOTE — ED Notes (Signed)
5:00 PM  03/15/22     Discharge instructions given to Edward Patel (name) with verbalization of understanding..  Patient discharged with the following prescriptions      Medication List        START taking these medications      diphenhydrAMINE 25 MG capsule  Commonly known as: Benadryl Allergy  Take 1 capsule by mouth every 6 hours as needed for Itching or Allergies     EPINEPHrine 0.3 MG/0.3ML Soaj injection  Commonly known as: EpiPen 2-Pak  Inject 0.3 mLs into the muscle once for 1 dose Use as directed for allergic reaction     famotidine 20 MG tablet  Commonly known as: Pepcid  Take 1 tablet by mouth 2 times daily for 10 days     predniSONE 20 MG tablet  Commonly known as: DELTASONE  Take 3 tablets by mouth daily for 3 days, THEN 2 tablets daily for 3 days, THEN 1 tablet daily for 3 days.  Start taking on: March 15, 2022               Where to Get Your Medications        These medications were sent to [facility] [address]  [place], [place] [address]      Phone: [phone]   diphenhydrAMINE 25 MG capsule  EPINEPHrine 0.3 MG/0.3ML Soaj injection  famotidine 20 MG tablet  predniSONE 20 MG tablet     . Patient discharged to Home.      Bonne Whack Leda Quail, RN       Felix Pacini, RN  03/15/22 1700

## 2023-07-11 ENCOUNTER — Inpatient Hospital Stay
Admit: 2023-07-11 | Discharge: 2023-07-11 | Disposition: A | Discharge status: Home / Self Care | Payer: TRICARE (CHAMPUS) | Attending: Emergency Medicine

## 2023-07-11 DIAGNOSIS — J101 Influenza due to other identified influenza virus with other respiratory manifestations: Secondary | ICD-10-CM

## 2023-07-11 LAB — COVID-19 & INFLUENZA COMBO
Rapid Influenza A By PCR: DETECTED — AB
Rapid Influenza B By PCR: NOT DETECTED
SARS-CoV-2, PCR: NOT DETECTED

## 2023-07-11 MED ORDER — ONDANSETRON 4 MG PO TBDP
4 MG | ORAL | Status: AC
Start: 2023-07-11 — End: 2023-07-11
  Administered 2023-07-11: 10:00:00 4 mg via ORAL

## 2023-07-11 MED ORDER — OSELTAMIVIR PHOSPHATE 75 MG PO CAPS
75 | ORAL_CAPSULE | Freq: Two times a day (BID) | ORAL | 0 refills | Status: AC
Start: 2023-07-11 — End: 2023-07-16

## 2023-07-11 MED ORDER — IBUPROFEN 600 MG PO TABS
600 | ORAL_TABLET | Freq: Four times a day (QID) | ORAL | 0 refills | 15.00000 days | Status: AC | PRN
Start: 2023-07-11 — End: ?

## 2023-07-11 MED ORDER — ONDANSETRON 4 MG PO TBDP
4 | ORAL_TABLET | Freq: Three times a day (TID) | ORAL | 0 refills | Status: AC | PRN
Start: 2023-07-11 — End: ?

## 2023-07-11 MED ORDER — IBUPROFEN 400 MG PO TABS
400 MG | ORAL | Status: AC
Start: 2023-07-11 — End: 2023-07-11
  Administered 2023-07-11: 10:00:00 800 mg via ORAL

## 2023-07-11 MED FILL — IBUPROFEN 400 MG PO TABS: 400 MG | ORAL | Qty: 2

## 2023-07-11 MED FILL — ONDANSETRON 4 MG PO TBDP: 4 MG | ORAL | Qty: 1

## 2023-07-11 NOTE — ED Triage Notes (Signed)
A&O male with c/o cough, fever, body aches, n/v x 2 days. Other family members have the flu.

## 2023-07-11 NOTE — ED Provider Notes (Signed)
[facility] EMERGENCY DEPARTMENT  EMERGENCY DEPARTMENT ENCOUNTER      Pt Name: Edward Patel  MRN: [id number]  Birthdate 1995-08-14  Date of evaluation: 07/11/2023  Provider: Erin Fulling, MD    CHIEF COMPLAINT       Chief Complaint   Patient presents with    Influenza       HPI:  Edward Patel is a 28 y.o. male who presents to the emergency department pt c/o cough/congestion/body aches/ nv  says h/o flu exposure w his two children. Same sx's  no cp.      HPI    Nursing Notes were reviewed.    REVIEW OF SYSTEMS    (2-9 systems for level 4, 10 or more for level 5)     Review of Systems    Except as noted above the remainder of the review of systems was reviewed and negative.       PAST MEDICAL HISTORY   History reviewed. No pertinent past medical history.      SURGICAL HISTORY     History reviewed. No pertinent surgical history.      CURRENT MEDICATIONS       Previous Medications    EPINEPHRINE (EPIPEN 2-PAK) 0.3 MG/0.3ML SOAJ INJECTION    Inject 0.3 mLs into the muscle once for 1 dose Use as directed for allergic reaction    FAMOTIDINE (PEPCID) 20 MG TABLET    Take 1 tablet by mouth 2 times daily for 10 days       ALLERGIES     Peanut-containing drug products    FAMILY HISTORY     History reviewed. No pertinent family history.       SOCIAL HISTORY       Social History     Socioeconomic History    Marital status: Married     Spouse name: None    Number of children: None    Years of education: None    Highest education level: None   Tobacco Use    Smoking status: Never     Passive exposure: Never    Smokeless tobacco: Never   Substance and Sexual Activity    Alcohol use: Never    Drug use: Never       SCREENINGS         Glasgow Coma Scale  Eye Opening: Spontaneous  Best Verbal Response: Oriented  Best Motor Response: Obeys commands  Glasgow Coma Scale Score: 15                     CIWA Assessment  BP: 114/75  Pulse: 96                 PHYSICAL EXAM    (up to 7 for level 4, 8 or more for level 5)     ED  Triage Vitals [07/11/23 0447]   BP Systolic BP Percentile Diastolic BP Percentile Temp Temp Source Pulse Respirations SpO2   114/75 -- -- 98.6 F (37 C) Oral 96 18 98 %      Height Weight - Scale         1.727 m (5\' 8" ) 65.8 kg (145 lb)             Physical Exam  Vitals and nursing note reviewed.   Constitutional:       Appearance: Normal appearance.   HENT:      Head: Normocephalic and atraumatic.      Nose: Congestion present.  Mouth/Throat:      Pharynx: No oropharyngeal exudate.      Comments: No swelling, no pta  Eyes:      Conjunctiva/sclera: Conjunctivae normal.   Cardiovascular:      Rate and Rhythm: Normal rate.      Pulses: Normal pulses.   Pulmonary:      Effort: Pulmonary effort is normal.      Breath sounds: No wheezing, rhonchi or rales.   Abdominal:      General: Abdomen is flat.      Palpations: Abdomen is soft.      Tenderness: There is no abdominal tenderness.   Musculoskeletal:         General: Normal range of motion.      Cervical back: Normal range of motion.   Skin:     Coloration: Skin is not pale.   Neurological:      General: No focal deficit present.      Mental Status: He is alert.   Psychiatric:         Mood and Affect: Mood normal.         DIAGNOSTIC RESULTS     EKG: All EKG's are interpreted by the Emergency Department Physician who either signs or Co-signs this chart in the absence of a cardiologist.      RADIOLOGY:   Non-plain film images such as CT, Ultrasound and MRI are read by the radiologist. Plain radiographic images are visualized and preliminarily interpreted by the emergency physician with the below findings:      Interpretation per the Radiologist below, if available at the time of this note:    No orders to display         LABS:  Labs Reviewed   COVID-19 & INFLUENZA COMBO - Abnormal; Notable for the following components:       Result Value    Rapid Influenza A By PCR Detected (*)     All other components within normal limits       All other labs were within normal  range or not returned as of this dictation.    EMERGENCY DEPARTMENT COURSE and DIFFERENTIAL DIAGNOSIS/MDM:   Vitals:    Vitals:    07/11/23 0447   BP: 114/75   Pulse: 96   Resp: 18   Temp: 98.6 F (37 C)   TempSrc: Oral   SpO2: 98%   Weight: 65.8 kg (145 lb)   Height: 1.727 m (5\' 8" )         Discussion:       No emc.  Tol secretions wnl.  Stable for dc and close fup.  Det ret inst given not c/w sepsis/hypoxia/bacteremia/toxicity. Agrees w dc plan         FINAL IMPRESSION      1. Influenza with respiratory manifestation other than pneumonia          DISPOSITION/PLAN   DISPOSITION Decision To Discharge 07/11/2023 05:42:29 AM      PATIENT REFERRED TO:  [facility] Emergency Department  [address]  [place] [address]  [phone]  Go to   As needed, If symptoms worsen    [facility] MEDICINE  [address]  [place] [address]  [phone]  Schedule an appointment as soon as possible for a visit in 2 days        DISCHARGE MEDICATIONS:  New Prescriptions    IBUPROFEN (ADVIL;MOTRIN) 600 MG TABLET    Take 1 tablet by mouth 4  times daily as needed for Pain    ONDANSETRON (ZOFRAN-ODT) 4 MG DISINTEGRATING TABLET    Take 1 tablet by mouth 3 times daily as needed for Nausea or Vomiting    OSELTAMIVIR (TAMIFLU) 75 MG CAPSULE    Take 1 capsule by mouth 2 times daily for 5 days     Controlled Substances Monitoring:          No data to display                (Please note that portions of this note were completed with a voice recognition program.  Efforts were made to edit the dictations but occasionally words are mis-transcribed.)    Erin Fulling, MD (electronically signed)  Attending Emergency Physician          Darryl Lent, MD  07/11/23 [phone]

## 2023-07-11 NOTE — Discharge Instructions (Signed)
Return for pain, fever not resolving with motrin or tylenol, shortness of breath, vomiting, decreased fluid intake, weakness, numbness, dizziness, or any change or concerns.

## 2023-07-11 NOTE — ED Notes (Signed)
I have reviewed discharge instructions with the patient.  The patient verbalized understanding.  Discharge medications reviewed with the patient and appropriate educational materials and side effects teaching were provided.
